# Patient Record
Sex: Female | Born: 1980 | Hispanic: Yes | Marital: Married
Health system: Southern US, Community
[De-identification: ages and names within clinical notes are randomized; demographics above are authoritative.]

---

## 2012-12-07 ENCOUNTER — Ambulatory Visit: Payer: Self-pay | Admitting: Advanced Practice Midwife

## 2013-01-13 ENCOUNTER — Ambulatory Visit: Payer: Self-pay | Admitting: Advanced Practice Midwife

## 2013-01-13 ENCOUNTER — Observation Stay: Payer: Self-pay | Admitting: Obstetrics and Gynecology

## 2013-01-13 LAB — RUPTURE OF MEMBRANE PLUS: Rom Plus: NOT DETECTED

## 2013-01-17 ENCOUNTER — Inpatient Hospital Stay: Payer: Self-pay | Admitting: Obstetrics and Gynecology

## 2013-01-17 LAB — CBC WITH DIFFERENTIAL/PLATELET
Basophil #: 0 10*3/uL (ref 0.0–0.1)
Eosinophil %: 1.6 %
HGB: 7.8 g/dL — ABNORMAL LOW (ref 12.0–16.0)
Lymphocyte %: 51.1 %
MCH: 30.4 pg (ref 26.0–34.0)
MCV: 91 fL (ref 80–100)
Neutrophil %: 41 %
Platelet: 133 10*3/uL — ABNORMAL LOW (ref 150–440)
RBC: 2.56 10*6/uL — ABNORMAL LOW (ref 3.80–5.20)
RDW: 13.7 % (ref 11.5–14.5)
WBC: 12.5 10*3/uL — ABNORMAL HIGH (ref 3.6–11.0)

## 2013-01-17 LAB — HEMOGLOBIN: HGB: 8.8 g/dL — ABNORMAL LOW (ref 12.0–16.0)

## 2013-01-18 LAB — HEMOGLOBIN: HGB: 8.1 g/dL — ABNORMAL LOW (ref 12.0–16.0)

## 2013-01-18 LAB — CBC WITH DIFFERENTIAL/PLATELET
Basophil #: 0 10*3/uL (ref 0.0–0.1)
Eosinophil #: 0.1 10*3/uL (ref 0.0–0.7)
Eosinophil %: 1.3 %
HCT: 23.9 % — ABNORMAL LOW (ref 35.0–47.0)
Lymphocyte %: 17.9 %
MCH: 30.1 pg (ref 26.0–34.0)
Monocyte #: 0.7 x10 3/mm (ref 0.2–0.9)
Neutrophil #: 8.3 10*3/uL — ABNORMAL HIGH (ref 1.4–6.5)
Neutrophil %: 74.4 %
Platelet: 127 10*3/uL — ABNORMAL LOW (ref 150–440)
RBC: 2.68 10*6/uL — ABNORMAL LOW (ref 3.80–5.20)
RDW: 14.2 % (ref 11.5–14.5)
WBC: 11.1 10*3/uL — ABNORMAL HIGH (ref 3.6–11.0)

## 2013-01-19 LAB — CBC WITH DIFFERENTIAL/PLATELET
Basophil %: 0.2 %
Eosinophil #: 0.3 10*3/uL (ref 0.0–0.7)
Eosinophil %: 3 %
HCT: 22.8 % — ABNORMAL LOW (ref 35.0–47.0)
HGB: 7.8 g/dL — ABNORMAL LOW (ref 12.0–16.0)
Lymphocyte #: 1.6 10*3/uL (ref 1.0–3.6)
Lymphocyte %: 17.3 %
MCHC: 34.4 g/dL (ref 32.0–36.0)
RBC: 2.56 10*6/uL — ABNORMAL LOW (ref 3.80–5.20)
RDW: 14.1 % (ref 11.5–14.5)
WBC: 9.3 10*3/uL (ref 3.6–11.0)

## 2013-01-19 LAB — PATHOLOGY REPORT

## 2014-10-23 IMAGING — US US OB US >=[ID] SNGL FETUS
1 series · 13 of 28 positions shown · non-contrast
Comparison: none

REASON FOR EXAM: anatomy dates
COMMENTS:

PROCEDURE:     US  - US OB GREATER/OR EQUAL TO [id number]  - December 07, 2012  [DATE]
RESULT:
TECHNIQUE: Real-time sonographic evaluation of the abdomen and pelvis was
performed.

[Series 1: us ob us >=(id) sngl fetus · 0.25mm/px · 13 of 88 slices shown]
[im 4/88]
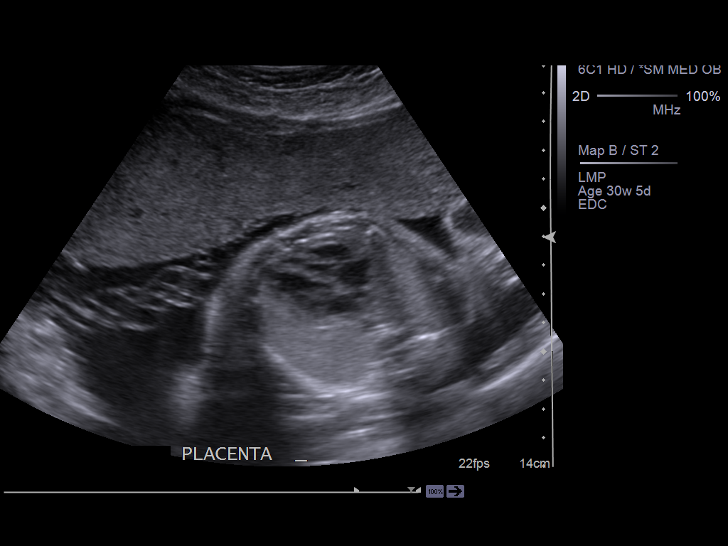
[im 10/88]
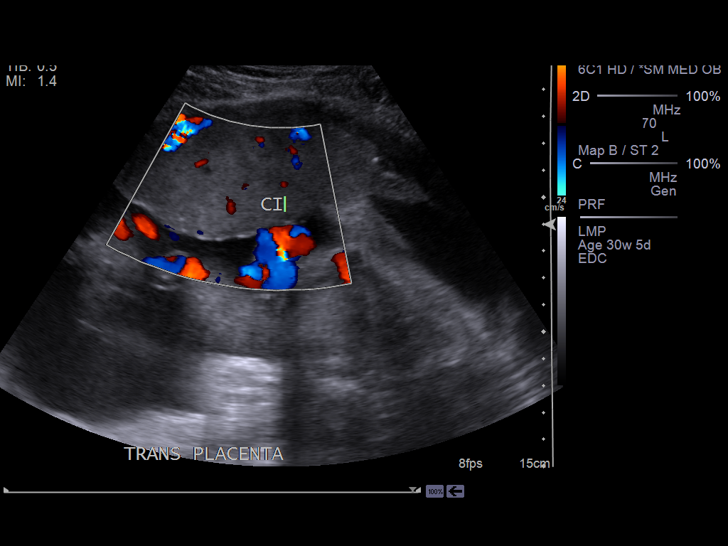
[im 17/88]
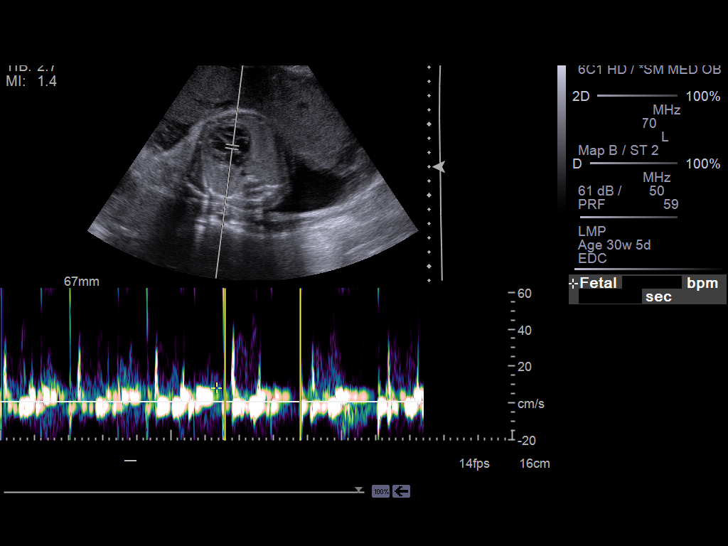
[im 23/88]
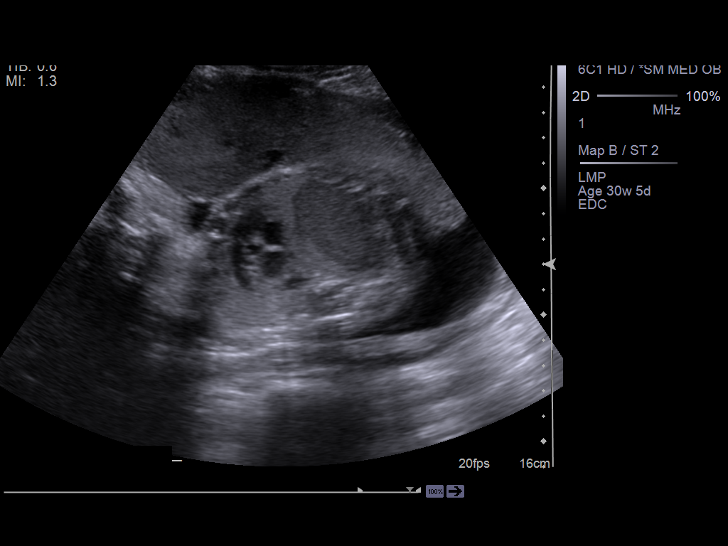
[im 30/88]
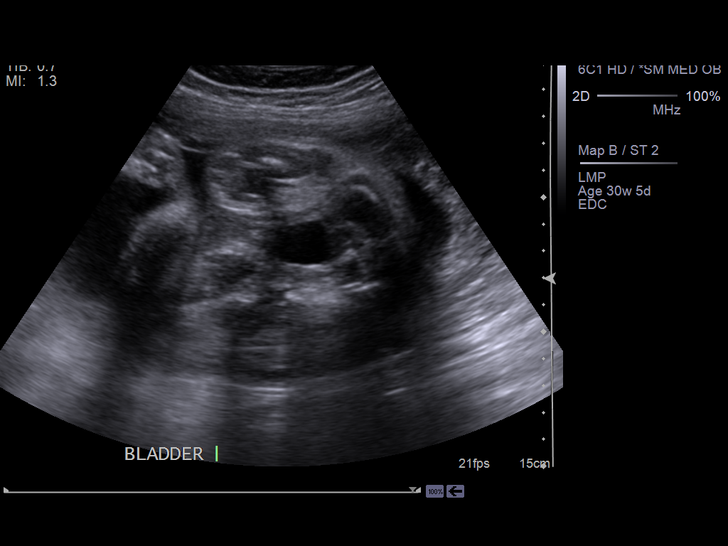
[im 36/88]
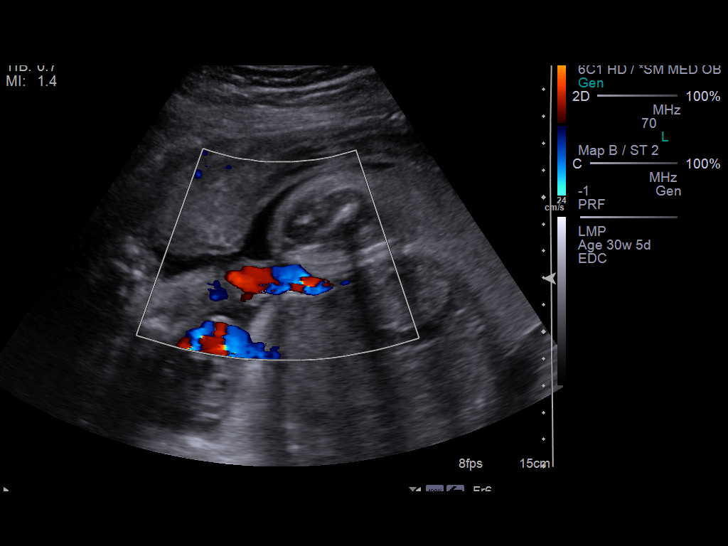
[im 46/88]
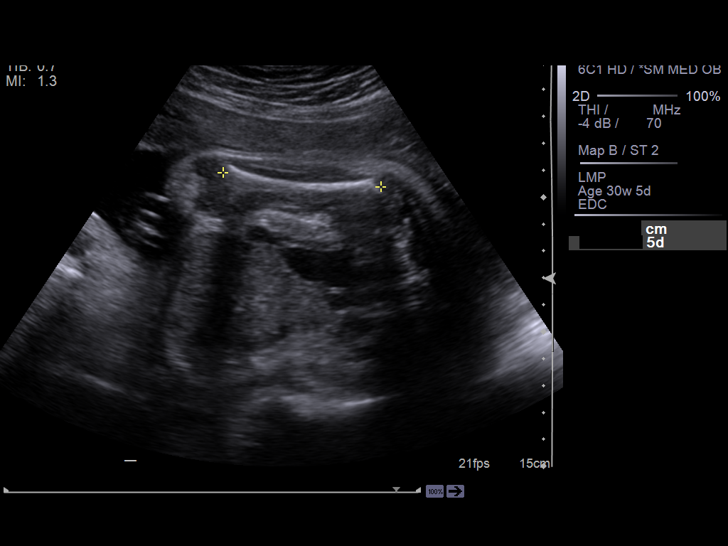
[im 52/88]
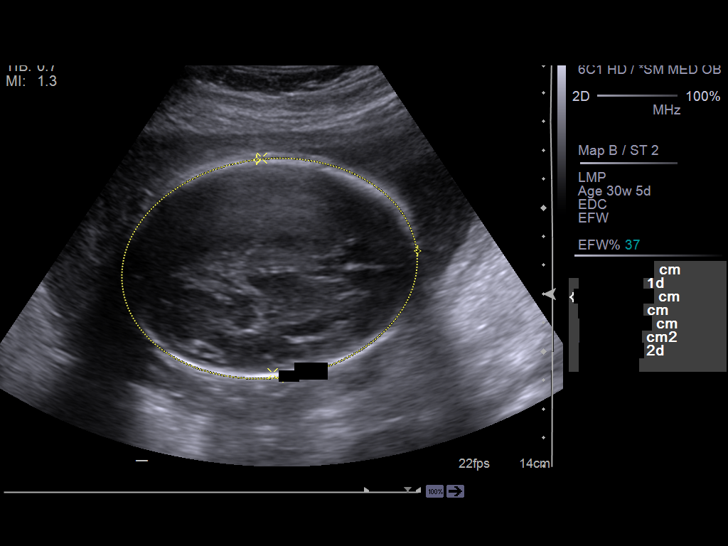
[im 59/88]
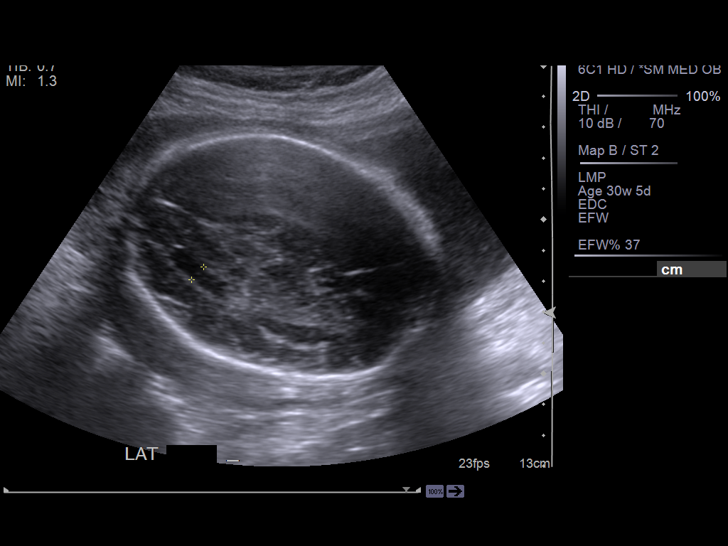
[im 65/88]
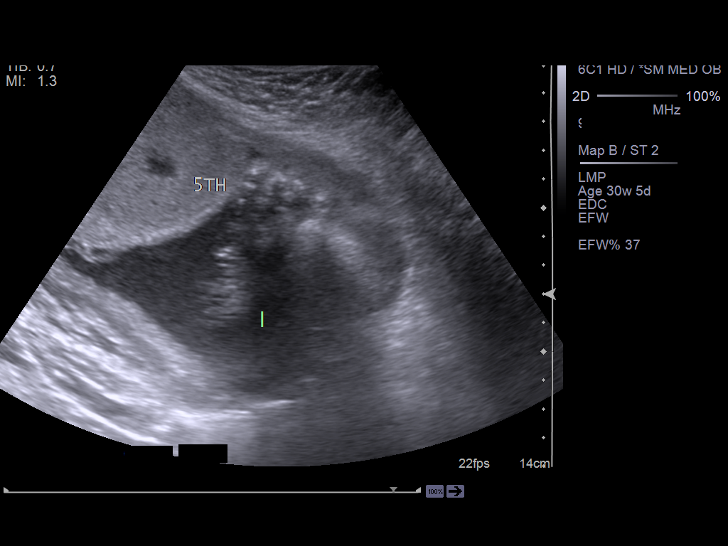
[im 71/88]
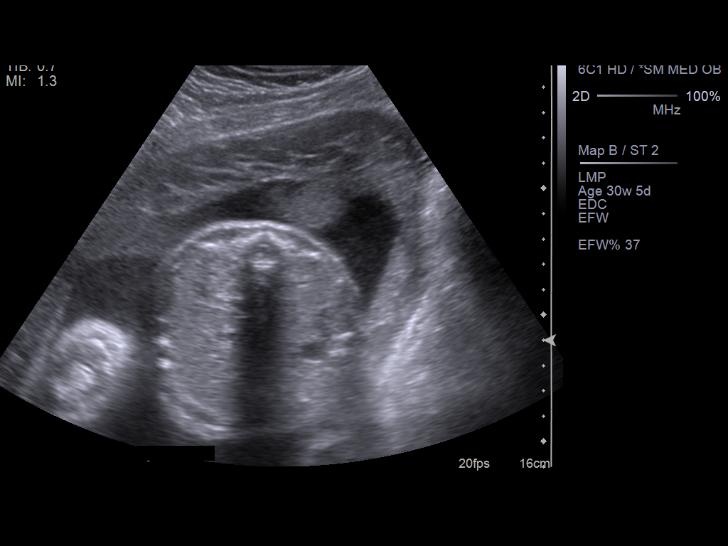
[im 78/88]
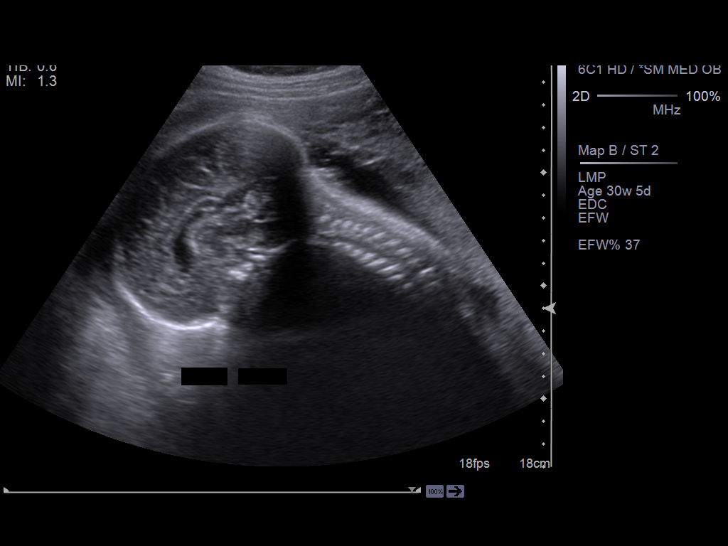
[im 84/88]
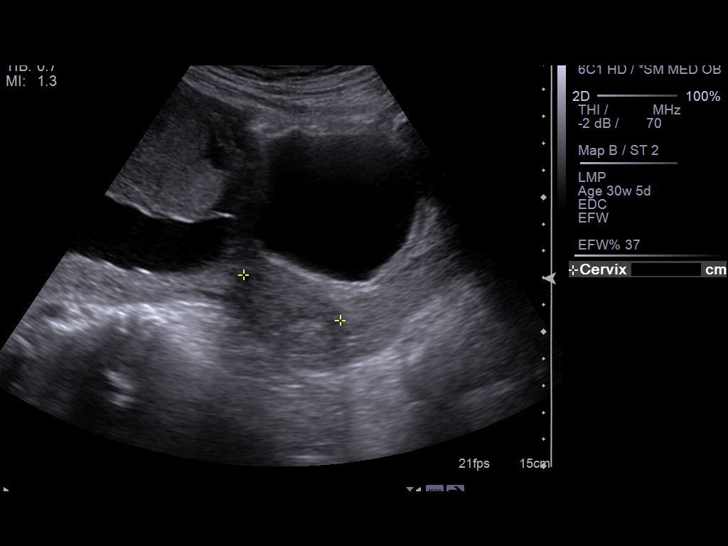

[13 of 28 positions shown; findings below may reference images not displayed]

FINDINGS: Single, viable intrauterine pregnancy is appreciated with
estimated fetal heart rate of 136 to 143 beats per minute. Cardiac trunk,
extremity and diaphragm movement is appreciated. Evaluation of the amniotic
fluid demonstrates an index of 14 cm which is at the 50th percentile. This
is within normal limits.

Quadrant measurements are:

Quadrant 1: 2.5 cm; quadrant 2: 2.67; quadrant 3: 4.27 and quadrant 4:

The placenta is Grade 1 and demonstrates an unremarkable echotexture. The
placenta is in an anterior location with placental tip approximately 2.33 to
3 cm from the closed cervical os.

Fetal anatomy:

BPD:  7.52 cm; EGA 30 weeks, 1 day

HC:  28.38; EGA 31 weeks, 1 day

AC: 26.55; EGA 30 weeks, 5 days

FL: 5.89; EGA 30 weeks, 5 days

Estimated fetal weight is 1,622 grams + / - 240 grams

Visualized evaluation of the fetal anatomy is limited secondary to fetal
positioning. The bladder, kidneys and stomach appear unremarkable. Limited
evaluation of the spine is unremarkable.
IMPRESSION: Single, viable intrauterine pregnancy with an estimated
gestational age of 30 weeks, 5 days per ultrasound with an EDC of 02/10/2013
per ultrasound.

## 2015-01-20 NOTE — Op Note (Signed)
PATIENT NAME:  Megan Kirby, Megan Kirby MR#:  [id number] DATE OF BIRTH:  02-Jun-1981  DATE OF PROCEDURE:  01/17/2013  PREOPERATIVE DIAGNOSES:   1.  A [redacted] week gestation.  2.  Limited prenatal care in [place] with minimal prenatal documentation on the chart.  3.  Heavy vaginal bleeding.  4.  Complaining of dizziness.  5.  Suspected abruption.  POSTOPERATIVE DIAGNOSES:   1.  A [redacted] week gestation.  2.  Limited prenatal care in [place] with minimal prenatal documentation on the chart.  3.  Heavy vaginal bleeding.  4.  Complaining of dizziness.  5.  Suspected abruption.   PROCEDURE:  Stat primary low transverse cesarean section.   SURGEON:  Ricky L. Logan Bores, M.D. with Adria Devon, MD and scrub tech.  ANESTHESIA:  General endotracheal by Dr. Noralyn Pick.   FINDINGS:  Unremarkable abdomen.  Uterus with anterior bruising, placenta mostly sheared free from abdominal wall.  Female infant.  Apgars 1 and 9.  Unable to obtain blood from cord due to no flow.   ESTIMATED BLOOD LOSS:  800 mL with surgery; estimated 800 mL vaginal prior to surgery.   DRAINS:  Foley.   COMPLICATIONS:  Retained sponge which was not noted until skin was closed.   PROCEDURE IN DETAIL:  The patient presented through the Emergency Room complaining of bleeding and feeling faint.  Controlling blood pressure seemed to be 50s/30s with a fetal heart rate of 85.  The patient was taken to the operating room, placed in the supine position.  Foley catheter was inserted, prepped and draped in the usual sterile fashion.  On okay from anesthesia, after establishing general anesthesia, a #10 blade was used to create a Pfannenstiel incision and stat cesarean section was carried out in the usual fashion with findings as above.   The uterine cavity was curettaged.  Uterine incision was closed in a running interlocking 0 chromic.  Two additional figures-of-eight were required towards the midline to control oozing.  Gross blood was  evacuated from the abdominal cavity, copiously irrigated.  The area seemed to be hemostatic.  The rectus muscle seemed to be hemostatic.  The fascia was closed left to right with 0 Vicryl.  SubQ was made hemostatic.  Skin was closed with surgical clips and a postoperative x-ray was ordered due to the fact we did not count.  There was a wispy area of signal approximately at the level of the pelvic brim and in counting, came up with an odd number, suspected retained lap.  Staples were removed.  Vicryl was removed and lap was found in the left gutter.  This was removed.  Uterine incision was reinspected and it seemed to be hemostatic.  Fascia was closed left to right with 0 Vicryl.  SubQ was hemostatic with cautery and the skin was reclosed with surgical clips.  Sterile dressing with pressure was applied.    The patient tolerated the procedure well.  She will be placed on a PCA.  We will watch clinically for any signs of DIC.  Two units of blood were given in the OR.   Please note that an ultrasound done in the hospital on April 16th did show a low fluid of 5.7 cm and the infant at that time was in transverse presentation with a grade 3 placenta noted.     ____________________________ Reatha Harps. Logan Bores, MD rle:ea D: 01/17/2013 05:23:50 ET T: 01/17/2013 06:13:40 ET JOB#: [id number]  cc: Clide Cliff L. Logan Bores, MD, <Dictator> [facility] Kristell Wooding L Laketa Sandoz  MD ELECTRONICALLY SIGNED 01/17/2013 14:36

## 2015-02-07 NOTE — H&P (Signed)
L&D Evaluation:  History:  HPI 34yo G2P1001 with LMP of 02/14/13 & EDD of 02/10/13 at 30 weeks with pNC at [facility] significant for transfer from [place] at 29 weeks, +FMH of DM (Dad), Glucose intolerance [facility] with [place] today indicting that she was low on fluid and might be leaking. Pt unsure as to when the leaking started. Pt is transverse.   Presents with leaking fluid   Patient's Medical History No Chronic Illness   Patient's Surgical History none   Medications Pre Natal Vitamins   Allergies Dipirona causes low BP, Metoclopromide causes HA and Fluoxetine causes anxiety   Social History none   Family History Dad:DM   ROS:  ROS All systems were reviewed.  HEENT, CNS, GI, GU, Respiratory, CV, Renal and Musculoskeletal systems were found to be normal.   Exam:  Vital Signs stable   General no apparent distress   Mental Status clear   Chest clear   Heart normal sinus rhythm, no murmur/gallop/rubs   Abdomen gravid, non-tender   Estimated Fetal Weight Average for gestational age   Fetal Position Trnsverse   Back no CVAT   Reflexes 1+   Clonus negative   Pelvic FT/30%/no presenting part palp   Mebranes Intact, Nitrazine neg, ferning neg. ROM plus sent   Description white   FHT normal rate with no decels, Ct I, Accels seen, no decels   Ucx absent   Skin dry   Lymph no lymphadenopathy   Impression:  Impression IUp at 36 weeks with Lof and transverse pos   Plan:  Plan Rom plus sent   Comments Tdap given on 12/02/12. Pt last ate a taco at 1245. US done today. Low AFI.   Electronic Signatures: Sharee PimpleJones, Caron W (CNM)  (Signed 16-Apr-14 15:45)  Authored: L&D Evaluation   Last Updated: 16-Apr-14 15:45 by Sharee PimpleJones, Caron W (CNM)

## 2015-02-07 NOTE — H&P (Signed)
L&D Evaluation:  History:  [facility] 34 y/[facility] 35+ weeks[facility] [place] to [facility]   Presents with vaginal bleeding   Patient's Medical History No Chronic Illness   Patient's Surgical History none   Medications Pre Natal Vitamins   Allergies NKDA, H/A with metocloperamide; prozac=anxiety;   Social History none   Family History Non-Contributory   ROS:  ROS All systems were reviewed.  HEENT, CNS, GI, GU, Respiratory, CV, Renal and Musculoskeletal systems were found to be normal., dizziness/feels faint   Exam:  Vital Signs BP 80/50's   General no apparent distress   Mental Status clear   Abdomen gravid, non-tender   Estimated Fetal Weight Average for gestational age   51FHT HR 4885 at assessment   Impression:  Impression VB with sx of hypovolemia   Plan:  Comments Stat C/S   Electronic Signatures: Margaretha GlassingEvans, Ricky L (MD)  (Signed 20-Apr-14 05:00)  Authored: L&D Evaluation   Last Updated: 20-Apr-14 05:00 by Margaretha GlassingEvans, Ricky L (MD)
